# Patient Record
Sex: Male | Born: 1995 | Race: Black or African American | Hispanic: No | Marital: Single | Smoking: Never smoker
Health system: Southern US, Community
[De-identification: ages and names within clinical notes are randomized; demographics above are authoritative.]

## PROBLEM LIST (undated history)

## (undated) DIAGNOSIS — J45909 Unspecified asthma, uncomplicated: Secondary | ICD-10-CM

---
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
## 2016-12-17 IMAGING — CR DG ABDOMEN ACUTE W/ 1V CHEST
3 series · 3 of 3 positions shown · non-contrast
Comparison: None.

CLINICAL DATA: Left upper quadrant pain for 2 days worse with deep
inspiration

EXAM:
DG ABDOMEN ACUTE W/ 1V CHEST

[chest pa]
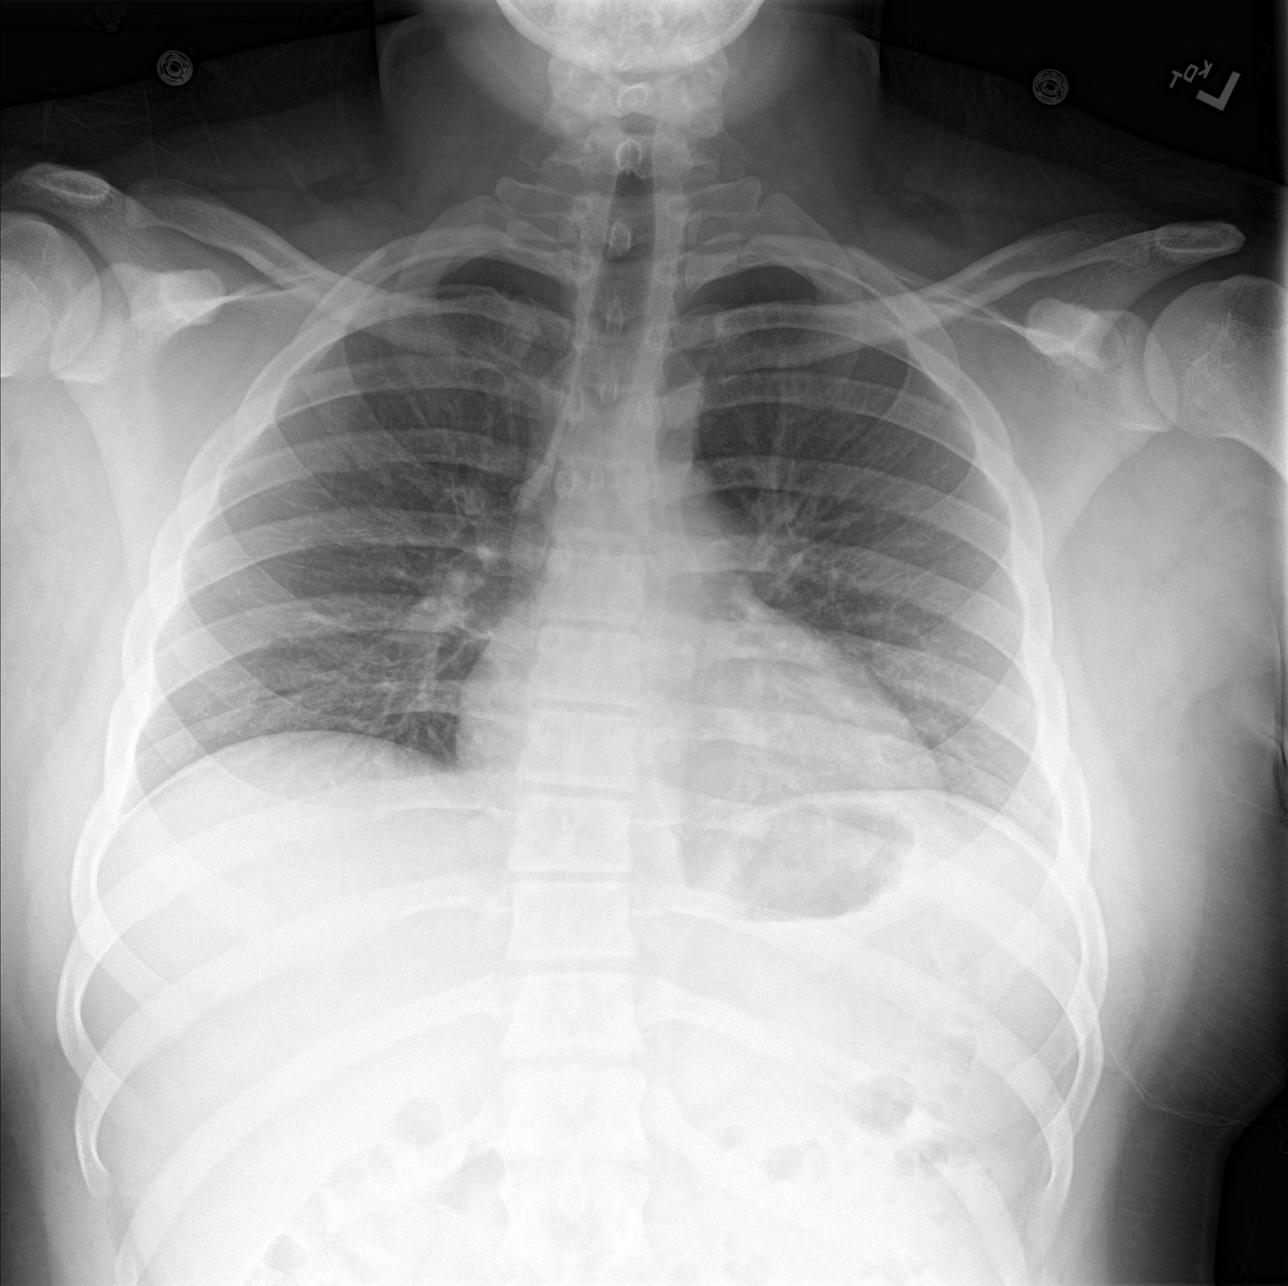

[abdomen erect]
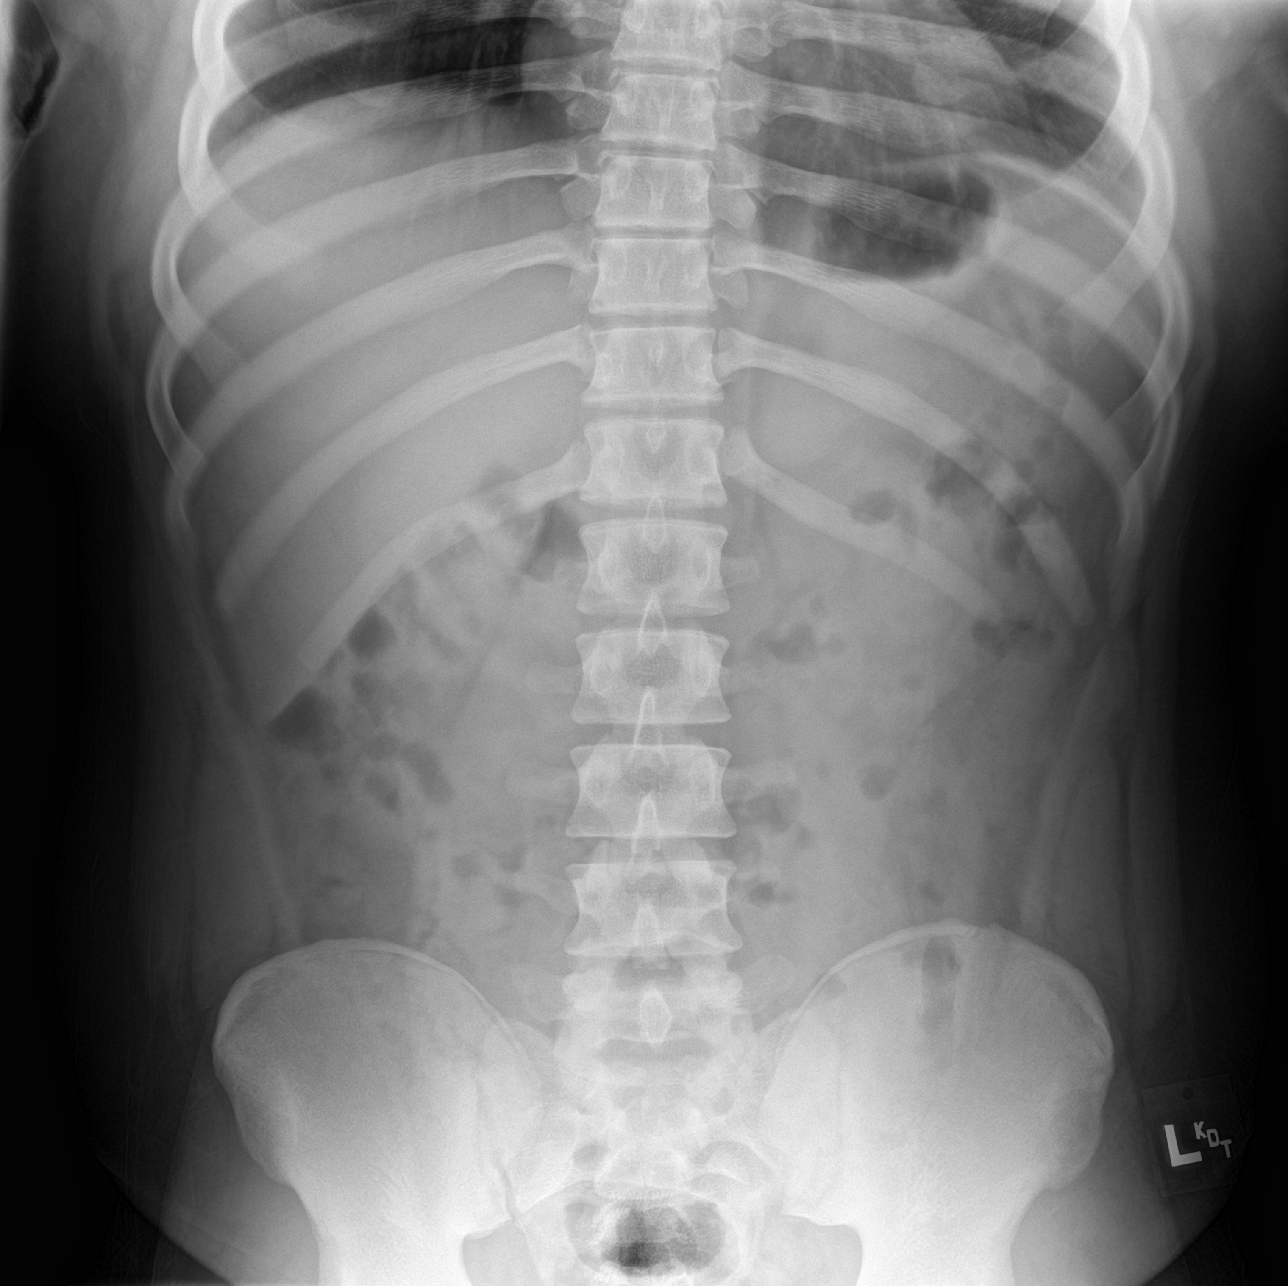

[abdomen supine]
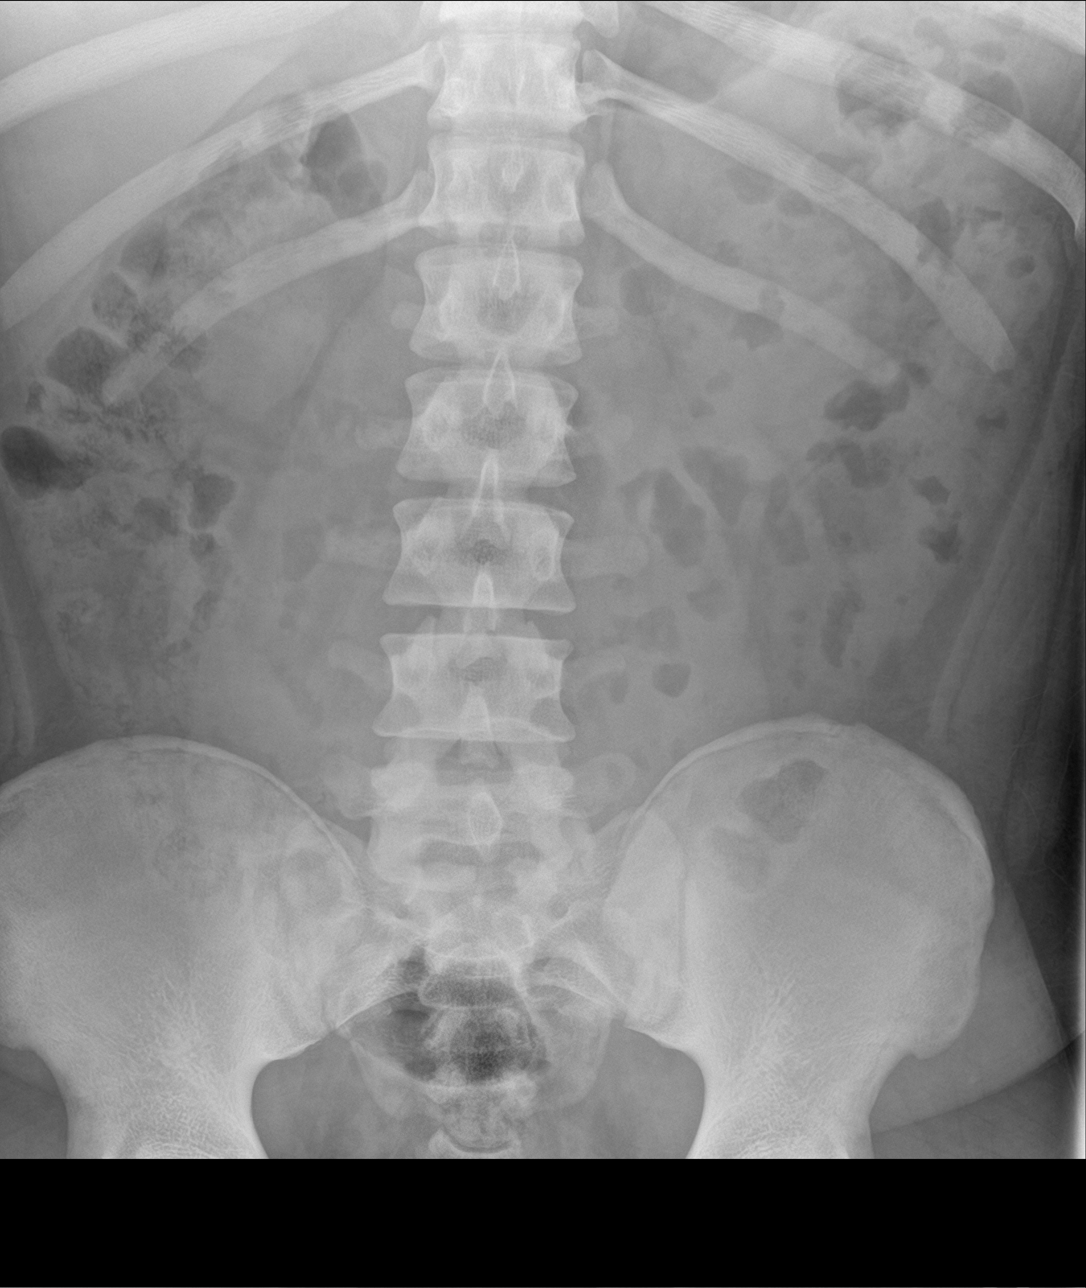

[3 of 3 positions shown; findings below may reference images not displayed]

FINDINGS: There is no evidence of dilated bowel loops or free intraperitoneal
air. No radiopaque calculi or other significant radiographic
abnormality is seen. Heart size and mediastinal contours are within
normal limits. Both lungs are clear.
IMPRESSION: Negative abdominal radiographs.  No acute cardiopulmonary disease.
